# Patient Record
Sex: Female | Born: 1935 | Hispanic: No | Smoking: Current every day smoker
Health system: Southern US, Community
[De-identification: ages and names within clinical notes are randomized; demographics above are authoritative.]

## PROBLEM LIST (undated history)

## (undated) DIAGNOSIS — E119 Type 2 diabetes mellitus without complications: Secondary | ICD-10-CM

## (undated) DIAGNOSIS — I10 Essential (primary) hypertension: Secondary | ICD-10-CM

## (undated) DIAGNOSIS — H3530 Unspecified macular degeneration: Secondary | ICD-10-CM

## (undated) DIAGNOSIS — K859 Acute pancreatitis without necrosis or infection, unspecified: Secondary | ICD-10-CM

## (undated) HISTORY — PX: HERNIA REPAIR: SHX51

## (undated) HISTORY — PX: CHOLECYSTECTOMY: SHX55

## (undated) HISTORY — PX: ABDOMINAL HYSTERECTOMY: SHX81

## (undated) HISTORY — PX: APPENDECTOMY: SHX54

## (undated) HISTORY — PX: FEMUR SURGERY: SHX943

---

## 2014-01-01 ENCOUNTER — Emergency Department (HOSPITAL_BASED_OUTPATIENT_CLINIC_OR_DEPARTMENT_OTHER)
Admission: EM | Admit: 2014-01-01 | Discharge: 2014-01-02 | Disposition: A | Discharge status: Home / Self Care | Payer: Medicare Other | Attending: Emergency Medicine | Admitting: Emergency Medicine

## 2014-01-01 ENCOUNTER — Encounter (HOSPITAL_BASED_OUTPATIENT_CLINIC_OR_DEPARTMENT_OTHER): Payer: Self-pay

## 2014-01-01 ENCOUNTER — Emergency Department (HOSPITAL_BASED_OUTPATIENT_CLINIC_OR_DEPARTMENT_OTHER): Payer: Medicare Other

## 2014-01-01 DIAGNOSIS — Z720 Tobacco use: Secondary | ICD-10-CM | POA: Insufficient documentation

## 2014-01-01 DIAGNOSIS — Z8719 Personal history of other diseases of the digestive system: Secondary | ICD-10-CM | POA: Diagnosis not present

## 2014-01-01 DIAGNOSIS — I10 Essential (primary) hypertension: Secondary | ICD-10-CM | POA: Insufficient documentation

## 2014-01-01 DIAGNOSIS — K047 Periapical abscess without sinus: Secondary | ICD-10-CM | POA: Diagnosis not present

## 2014-01-01 DIAGNOSIS — R220 Localized swelling, mass and lump, head: Secondary | ICD-10-CM | POA: Diagnosis present

## 2014-01-01 DIAGNOSIS — Z7982 Long term (current) use of aspirin: Secondary | ICD-10-CM | POA: Diagnosis not present

## 2014-01-01 DIAGNOSIS — Z8659 Personal history of other mental and behavioral disorders: Secondary | ICD-10-CM | POA: Insufficient documentation

## 2014-01-01 DIAGNOSIS — E119 Type 2 diabetes mellitus without complications: Secondary | ICD-10-CM | POA: Insufficient documentation

## 2014-01-01 HISTORY — DX: Acute pancreatitis without necrosis or infection, unspecified: K85.90

## 2014-01-01 HISTORY — DX: Type 2 diabetes mellitus without complications: E11.9

## 2014-01-01 HISTORY — DX: Unspecified macular degeneration: H35.30

## 2014-01-01 HISTORY — DX: Essential (primary) hypertension: I10

## 2014-01-01 LAB — CBC WITH DIFFERENTIAL/PLATELET
Basophils Absolute: 0.1 10*3/uL (ref 0.0–0.1)
Basophils Relative: 0 % (ref 0–1)
EOS ABS: 0.5 10*3/uL (ref 0.0–0.7)
EOS PCT: 3 % (ref 0–5)
HEMATOCRIT: 41 % (ref 36.0–46.0)
HEMOGLOBIN: 13.6 g/dL (ref 12.0–15.0)
LYMPHS PCT: 19 % (ref 12–46)
Lymphs Abs: 3.5 10*3/uL (ref 0.7–4.0)
MCH: 29.4 pg (ref 26.0–34.0)
MCHC: 33.2 g/dL (ref 30.0–36.0)
MCV: 88.6 fL (ref 78.0–100.0)
MONO ABS: 1.6 10*3/uL — AB (ref 0.1–1.0)
MONOS PCT: 9 % (ref 3–12)
Neutro Abs: 13.1 10*3/uL — ABNORMAL HIGH (ref 1.7–7.7)
Neutrophils Relative %: 69 % (ref 43–77)
Platelets: 382 10*3/uL (ref 150–400)
RBC: 4.63 MIL/uL (ref 3.87–5.11)
RDW: 14.2 % (ref 11.5–15.5)
WBC: 18.9 10*3/uL — AB (ref 4.0–10.5)

## 2014-01-01 LAB — BASIC METABOLIC PANEL
Anion gap: 17 — ABNORMAL HIGH (ref 5–15)
BUN: 11 mg/dL (ref 6–23)
CO2: 23 meq/L (ref 19–32)
Calcium: 9.5 mg/dL (ref 8.4–10.5)
Chloride: 98 mEq/L (ref 96–112)
Creatinine, Ser: 0.5 mg/dL (ref 0.50–1.10)
GFR calc Af Amer: >90 mL/min (ref >90)
GFR calc non Af Amer: 86 mL/min — ABNORMAL LOW (ref >90)
Glucose, Bld: 258 mg/dL — ABNORMAL HIGH (ref 70–99)
POTASSIUM: 4 meq/L (ref 3.7–5.3)
SODIUM: 138 meq/L (ref 137–147)

## 2014-01-01 MED ORDER — SODIUM CHLORIDE 0.9 % IV SOLN
Freq: Once | INTRAVENOUS | Status: AC
  Administered 2014-01-01: 21:00:00 via INTRAVENOUS

## 2014-01-01 MED ORDER — MORPHINE SULFATE 2 MG/ML IJ SOLN
2.0000 mg | Freq: Once | INTRAMUSCULAR | Status: AC
  Administered 2014-01-01: 2 mg via INTRAVENOUS
  Filled 2014-01-01: qty 1

## 2014-01-01 MED ORDER — SODIUM CHLORIDE 0.9 % IV BOLUS (SEPSIS)
1000.0000 mL | Freq: Once | INTRAVENOUS | Status: AC
  Administered 2014-01-01: 1000 mL via INTRAVENOUS

## 2014-01-01 MED ORDER — CLINDAMYCIN PHOSPHATE 600 MG/50ML IV SOLN
600.0000 mg | Freq: Once | INTRAVENOUS | Status: AC
  Administered 2014-01-01: 600 mg via INTRAVENOUS
  Filled 2014-01-01: qty 50

## 2014-01-01 NOTE — ED Provider Notes (Signed)
CSN: [id number]     Arrival date & time 01/01/14  1839 History  This chart was scribed for Angelica OctaveStephen Izabell Schalk, MD by Evon Slackerrance Branch, ED Scribe. This patient was seen in room MH03/MH03 and the patient's care was started at 8:20 PM.      Chief Complaint  Patient presents with  . Facial Swelling   The history is provided by the patient. No language interpreter was used.   HPI Comments: Angelica FredricksonGladyce Ruiz is a 80 y.o. female who presents to the Emergency Department complaining of gradually worsening facial swelling to her chin onset 1 week ago. She states that the swelling started as a small bump. She states she has applied warm compress with no relief. She states she has tried tylenol with no relief. Denies fever, dental problem, vomiting, CP, difficulty breathing or trouble swallowing. She denies injury or fall. She states she is a current every day smoker.     PCP Dr. Montez Moritaarter   Past Medical History  Diagnosis Date  . Diabetes mellitus without complication   . Hypertension   . Pancreatitis   . Macular degeneration    Past Surgical History  Procedure Laterality Date  . Abdominal hysterectomy    . Cholecystectomy    . Appendectomy    . Femur surgery    . Hernia repair     No family history on file. History  Substance Use Topics  . Smoking status: Current Every Day Smoker  . Smokeless tobacco: Not on file  . Alcohol Use: No   OB History    No data available     Review of Systems  Constitutional: Negative for fever.  HENT: Positive for facial swelling. Negative for dental problem and trouble swallowing.   Respiratory: Negative for shortness of breath.   Cardiovascular: Negative for chest pain.  Gastrointestinal: Negative for vomiting.   A complete 10 system review of systems was obtained and all systems are negative except as noted in the HPI and PMH.     Allergies  Sulfa antibiotics  Home Medications   Prior to Admission medications   Medication Sig Start Date End Date  Taking? Authorizing Provider  AmLODIPine Besylate (NORVASC PO) Take by mouth.   Yes Historical Provider, MD  aspirin 81 MG tablet Take 81 mg by mouth daily.   Yes Historical Provider, MD  lipase/protease/amylase (CREON) 12000 UNITS CPEP capsule Take by mouth.   Yes Historical Provider, MD  METFORMIN HCL PO Take by mouth.   Yes Historical Provider, MD  Olmesartan Medoxomil (BENICAR PO) Take by mouth.   Yes Historical Provider, MD  Sertraline HCl (ZOLOFT PO) Take by mouth.   Yes Historical Provider, MD   Triage Vitals: BP 182/64 mmHg  Pulse 87  Temp(Src) 98.7 F (37.1 C) (Oral)  Resp 18  Ht 5\' 4"  (1.626 m)  Wt 109 lb (49.442 kg)  BMI 18.70 kg/m2  SpO2 99%  Physical Exam  Constitutional: She is oriented to person, place, and time. She appears well-developed and well-nourished. No distress.  HENT:  Head: Normocephalic and atraumatic.  Mouth/Throat: Oropharynx is clear and moist. No oropharyngeal exudate.  Right lower jaw swelling, multiple teeth broken off at gum line, tender to palpation along buccal surface of lower jaw, floor of mouth is soft, no pain with tongue movement   Eyes: Conjunctivae and EOM are normal. Pupils are equal, round, and reactive to light.  Neck: Normal range of motion. Neck supple.  No meningismus.  Cardiovascular: Normal rate, regular rhythm, normal heart sounds  and intact distal pulses.   No murmur heard. Pulmonary/Chest: Effort normal and breath sounds normal. No respiratory distress.  Abdominal: Soft. There is no tenderness. There is no rebound and no guarding.  Musculoskeletal: Normal range of motion. She exhibits no edema or tenderness.  Neurological: She is alert and oriented to person, place, and time. No cranial nerve deficit. She exhibits normal muscle tone. Coordination normal.  No ataxia on finger to nose bilaterally. No pronator drift. 5/5 strength throughout. CN 2-12 intact. Negative Romberg. Equal grip strength. Sensation intact. Gait is normal.    Skin: Skin is warm.  Psychiatric: She has a normal mood and affect. Her behavior is normal.  Nursing note and vitals reviewed.   ED Course  Procedures (including critical care time) DIAGNOSTIC STUDIES: Oxygen Saturation is 99% on RA, normal by my interpretation.    COORDINATION OF CARE: 8:28 PM-Discussed treatment plan with pt at bedside and pt agreed to plan.     Labs Review Labs Reviewed  BASIC METABOLIC PANEL - Abnormal; Notable for the following:    Glucose, Bld 258 (*)    GFR calc non Af Amer 86 (*)    Anion gap 17 (*)    All other components within normal limits  CBC WITH DIFFERENTIAL - Abnormal; Notable for the following:    WBC 18.9 (*)    Neutro Abs 13.1 (*)    Monocytes Absolute 1.6 (*)    All other components within normal limits  CBG MONITORING, ED - Abnormal; Notable for the following:    Glucose-Capillary 134 (*)    All other components within normal limits    Imaging Review Ct Maxillofacial Wo Cm  01/01/2014   CLINICAL DATA:  Enlarging lump on chin for 1 week. Redness and swelling.  EXAM: CT MAXILLOFACIAL WITHOUT CONTRAST  TECHNIQUE: Multidetector CT imaging of the maxillofacial structures was performed. Multiplanar CT image reconstructions were also generated. A small metallic BB was placed on the right temple in order to reliably differentiate right from left. Reported history of dye allergy.  COMPARISON:  None.  FINDINGS: Poor dentition with multiple dental caries and large RIGHT mandible periapical lucency/abscess. Overlying soft tissue swelling/ reticulated fat with punctate calcification. No subcutaneous gas. No definite fluid collection.  Patient is osteopenic. No facial fracture. Paranasal sinuses and imaged mastoid air cells appear well-aerated.  Status post bilateral ocular lens implants. Ocular globes and orbital contents are otherwise unremarkable. Moderate calcific atherosclerosis of the carotid siphons. Moderate calcific atherosclerosis of the carotid  bulbs.  IMPRESSION: RIGHT mandible soft tissue swelling/cellulitis likely odontogenic in origin considering dental caries and large RIGHT mandible periapical abscess. No definite fluid collection on these noncontrast images.   Electronically Signed   By: Awilda Metroourtnay  Bloomer   On: 01/01/2014 22:53     EKG Interpretation None      MDM   Final diagnoses:  Facial swelling  Dental abscess    1 week history of right-sided lower facial swelling. No difficulty breathing or swallowing.  Poor dentition with swelling to right lower jaw. Floor of mouth is soft. No evidence of Ludwig angina.  Hyperglycemia of 258.  Patient reports contrast allergy.  Noncontrast CT scan shows right mandible cellulitis and dental infection and periapical abscess. No drainable fluid collection on images.  Declines any bedside drainage attempt. She is in no distress. She is tolerating by mouth. Floor of mouth is soft. ROM Tongue is full.  Patient informed she needs dental follow-up ASAP. [place] Tilden D[place]. She will also be given a dentist on call Dr. Lawrence Marseilles.  IV clindamycin given and IVF for hyperglycemia. Patient's daughter is Charity fundraiser at [employer].  She is agreeable to observation admission given extent of patient's infection, age, and diabetes history.  D/w Dr. Lavella Hammock at [facility] who accepts patient in transfer.  I personally performed the services described in this documentation, which was scribed in my presence. The recorded information has been reviewed and is accurate.    Angelica Octave, MD 01/02/14 [phone]

## 2014-01-01 NOTE — ED Notes (Signed)
C/o pain, swelling and weakness. (Denies: nvd, fever, sob or other sx), alert, NAD< calm, interactive, resps e/u, speaking in clear complete sentences, no dyspnea noted.

## 2014-01-01 NOTE — ED Notes (Signed)
C/o swelling to chin x 1 week-denies injury/exposure

## 2014-01-01 NOTE — ED Notes (Signed)
[facility] Man[facility]ancour [facility]

## 2014-01-02 DIAGNOSIS — E119 Type 2 diabetes mellitus without complications: Secondary | ICD-10-CM | POA: Diagnosis not present

## 2014-01-02 DIAGNOSIS — Z720 Tobacco use: Secondary | ICD-10-CM | POA: Diagnosis not present

## 2014-01-02 DIAGNOSIS — R220 Localized swelling, mass and lump, head: Secondary | ICD-10-CM | POA: Diagnosis present

## 2014-01-02 DIAGNOSIS — Z7982 Long term (current) use of aspirin: Secondary | ICD-10-CM | POA: Diagnosis not present

## 2014-01-02 DIAGNOSIS — Z8659 Personal history of other mental and behavioral disorders: Secondary | ICD-10-CM | POA: Diagnosis not present

## 2014-01-02 DIAGNOSIS — K047 Periapical abscess without sinus: Secondary | ICD-10-CM | POA: Diagnosis not present

## 2014-01-02 DIAGNOSIS — I10 Essential (primary) hypertension: Secondary | ICD-10-CM | POA: Diagnosis not present

## 2014-01-02 DIAGNOSIS — Z8719 Personal history of other diseases of the digestive system: Secondary | ICD-10-CM | POA: Diagnosis not present

## 2014-01-02 LAB — CBG MONITORING, ED: GLUCOSE-CAPILLARY: 134 mg/dL — AB (ref 70–99)

## 2014-01-02 NOTE — ED Notes (Signed)
Paged carelink spoke with carelink rep Delice Bisonara, stated truck would arrive in 15 mins. Information for report and bed assignment  giving to Angelica RuizJohn

## 2014-01-02 NOTE — ED Notes (Signed)
No changes.  Pt alert, NAD, calm, interactive, [facility].  pending contact with IM.

## 2014-05-13 ENCOUNTER — Other Ambulatory Visit: Payer: Self-pay | Admitting: Internal Medicine

## 2016-06-18 IMAGING — CT CT MAXILLOFACIAL W/O CM
3 series · 15 of 47 positions shown, 18 images · non-contrast
Comparison: None.

CLINICAL DATA: Enlarging lump on chin for 1 week. Redness and
swelling.

EXAM:
CT MAXILLOFACIAL WITHOUT CONTRAST
TECHNIQUE: Multidetector CT imaging of the maxillofacial structures was
performed. Multiplanar CT image reconstructions were also generated.
A small metallic BB was placed on the right temple in order to
reliably differentiate right from left. Reported history of dye
allergy.

[Series 3: maxillofacial 2.0 h30s st · axial · 0.33mm/px · z∈[-231,-81]mm · 9 of 87 slices shown, 12 images]
[im 6/87  brain]
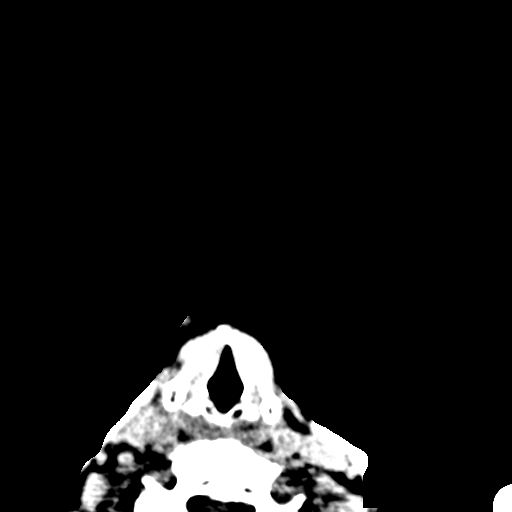
[im 6/87  bone]
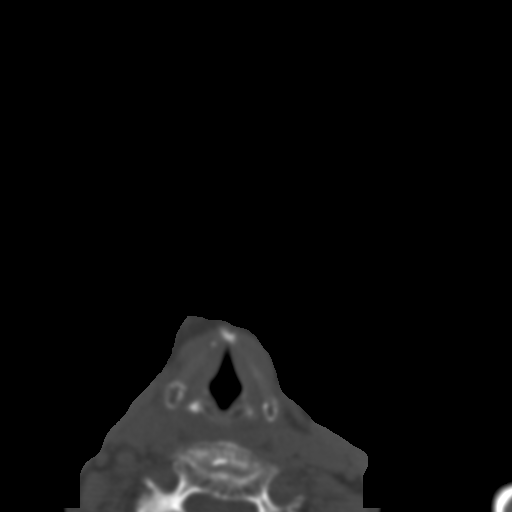
[im 15/87  bone]
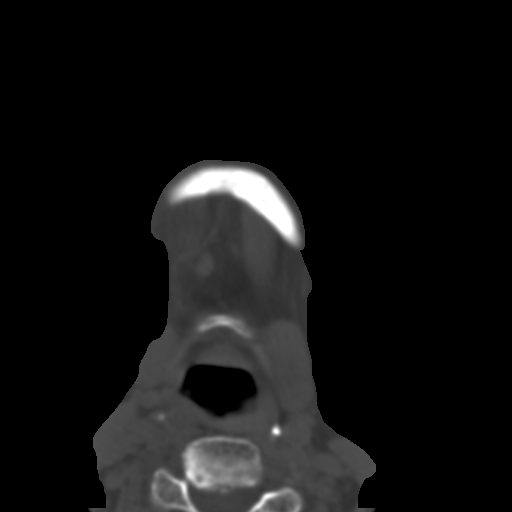
[im 24/87  bone]
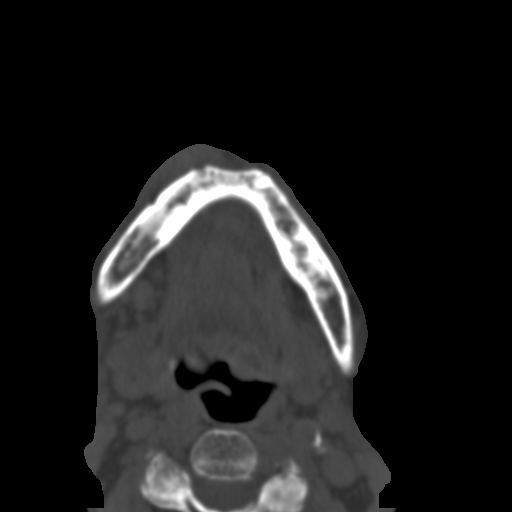
[im 33/87  bone]
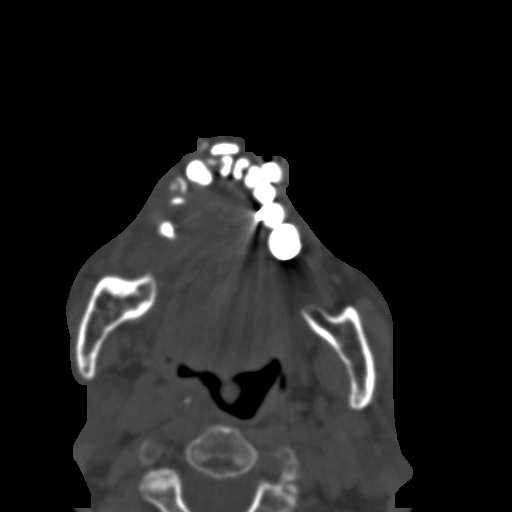
[im 45/87  brain]
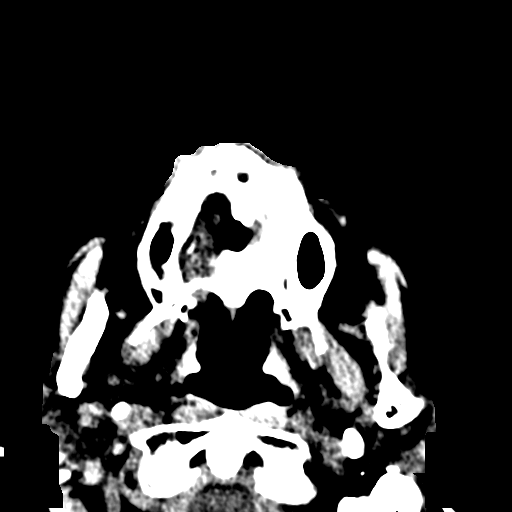
[im 45/87  bone]
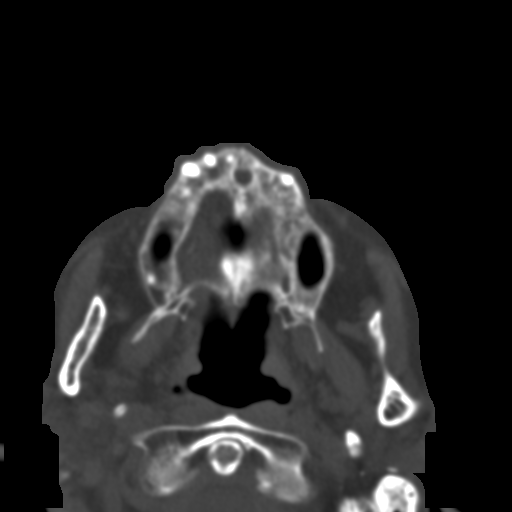
[im 54/87  bone]
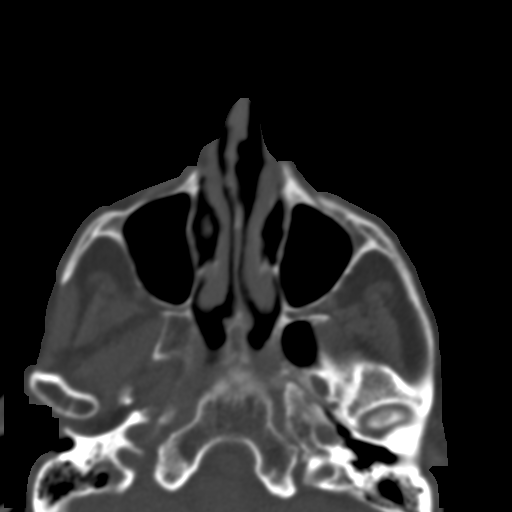
[im 63/87  bone]
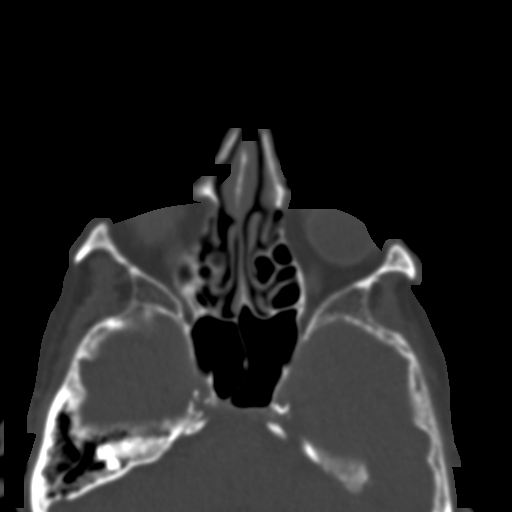
[im 72/87  bone]
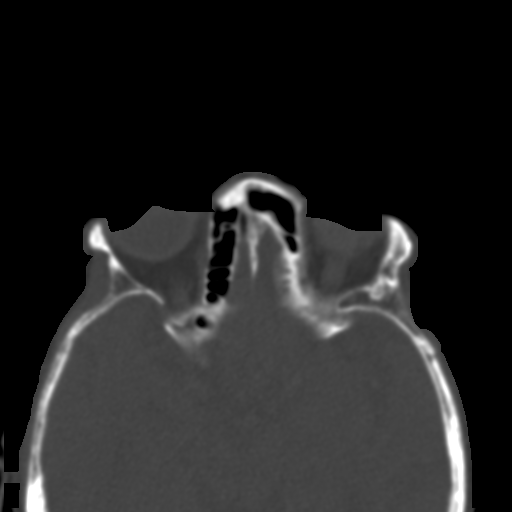
[im 81/87  brain]
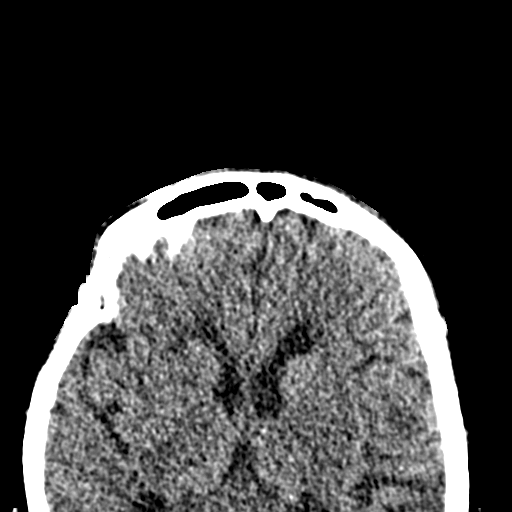
[im 81/87  bone]
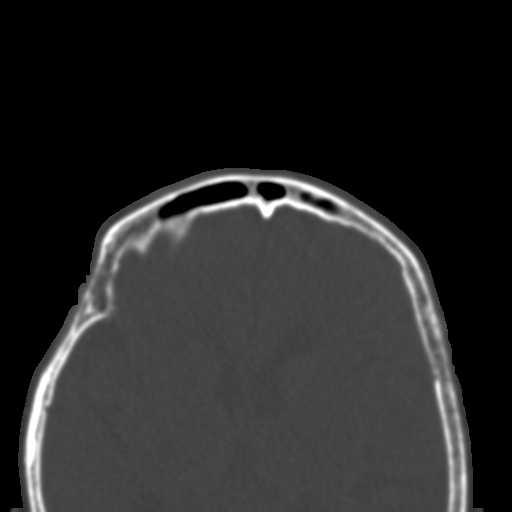

[Series 8: maxillofacial 2.0 coronal · coronal · 0.33mm/px · 3 of 71 slices shown]
[im 24/71  bone]
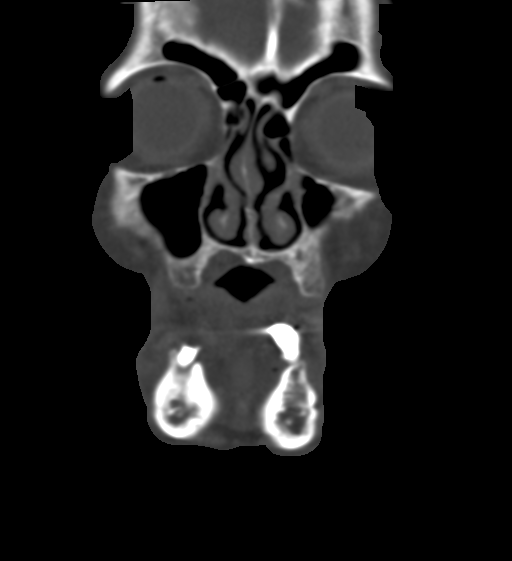
[im 32/71  bone]
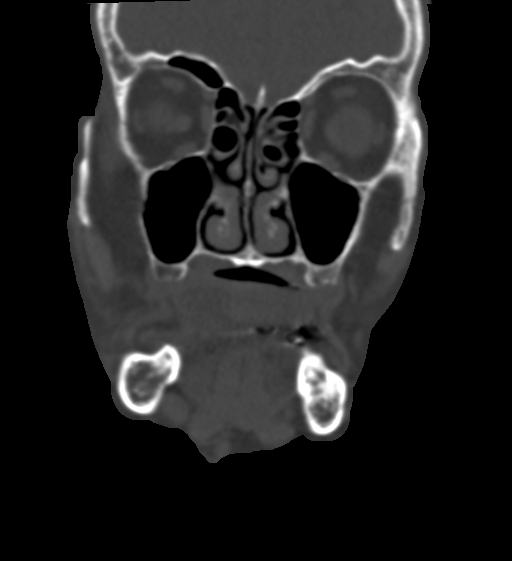
[im 39/71  bone]
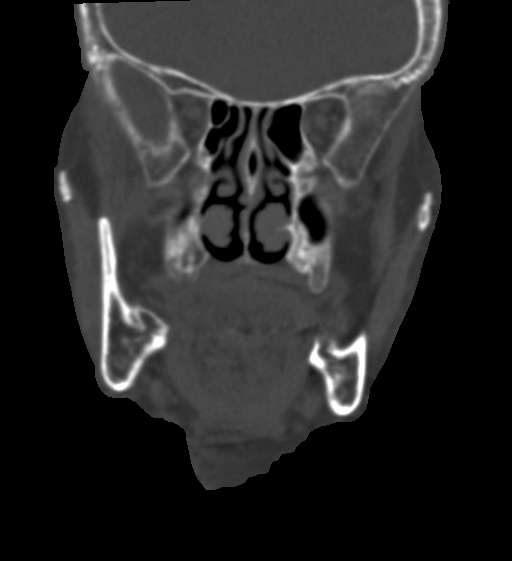

[Series 9: maxillofacial 2.0 sagittal · sagittal · 0.29mm/px · 3 of 80 slices shown]
[im 27/80  bone]
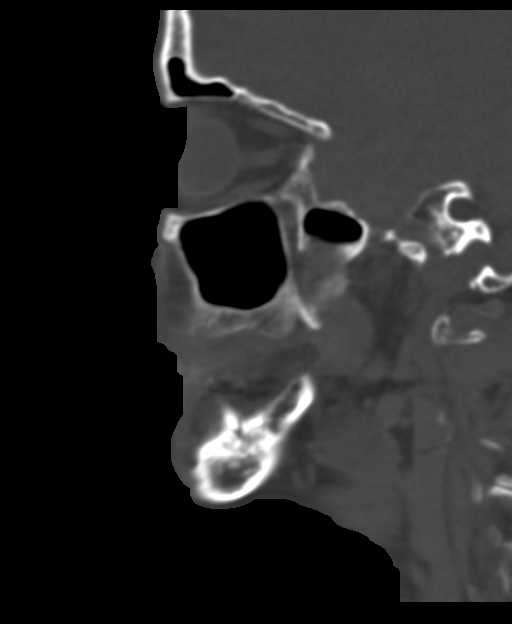
[im 40/80  bone]
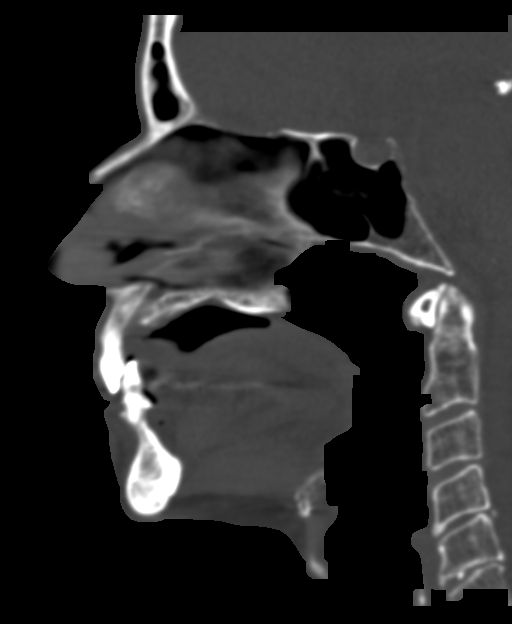
[im 53/80  bone]
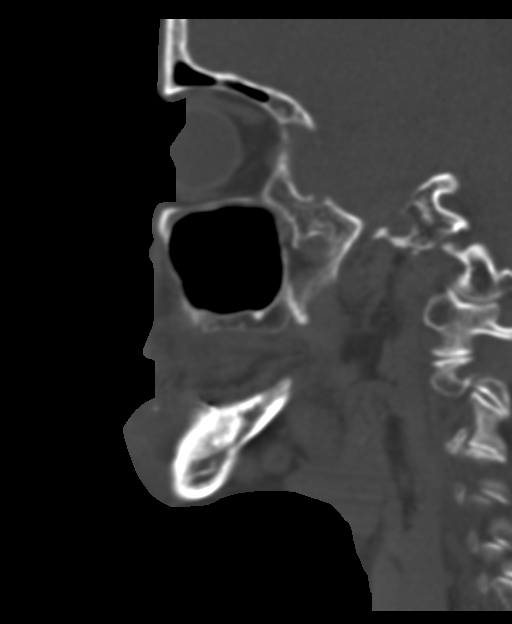

[15 of 47 positions shown; findings below may reference images not displayed]

FINDINGS: Poor dentition with multiple dental caries and large RIGHT mandible
periapical lucency/abscess. Overlying soft tissue swelling/
reticulated fat with punctate calcification. No subcutaneous gas. No
definite fluid collection.

Patient is osteopenic. No facial fracture. Paranasal sinuses and
imaged mastoid air cells appear well-aerated.

Status post bilateral ocular lens implants. Ocular globes and
orbital contents are otherwise unremarkable. Moderate calcific
atherosclerosis of the carotid siphons. Moderate calcific
atherosclerosis of the carotid bulbs.
IMPRESSION: RIGHT mandible soft tissue swelling/cellulitis likely odontogenic in
origin considering dental caries and large RIGHT mandible periapical
abscess. No definite fluid collection on these noncontrast images.

  By: Md Sofiq Biji

## 2017-09-26 ENCOUNTER — Encounter: Payer: Self-pay | Admitting: Internal Medicine

## 2018-12-08 DEATH — deceased
